# Patient Record
Sex: Female | Born: 1985 | Race: Black or African American | Hispanic: No | Marital: Single | Smoking: Former smoker
Health system: Southern US, Community
[De-identification: ages and names within clinical notes are randomized; demographics above are authoritative.]

## PROBLEM LIST (undated history)

## (undated) DIAGNOSIS — T7840XA Allergy, unspecified, initial encounter: Secondary | ICD-10-CM

## (undated) HISTORY — DX: Allergy, unspecified, initial encounter: T78.40XA

---

## 2007-03-13 ENCOUNTER — Emergency Department (HOSPITAL_COMMUNITY): Admission: EM | Admit: 2007-03-13 | Discharge: 2007-03-13 | Payer: Self-pay | Admitting: Emergency Medicine

## 2007-03-27 ENCOUNTER — Emergency Department (HOSPITAL_COMMUNITY): Admission: EM | Admit: 2007-03-27 | Discharge: 2007-03-27 | Payer: Self-pay | Admitting: Adult Health

## 2007-04-11 ENCOUNTER — Emergency Department (HOSPITAL_COMMUNITY): Admission: EM | Admit: 2007-04-11 | Discharge: 2007-04-11 | Payer: Self-pay | Admitting: Emergency Medicine

## 2009-06-17 ENCOUNTER — Emergency Department (HOSPITAL_COMMUNITY): Admission: EM | Admit: 2009-06-17 | Discharge: 2009-06-17 | Payer: Self-pay | Admitting: Family Medicine

## 2010-11-25 LAB — POCT RAPID STREP A: Streptococcus, Group A Screen (Direct): POSITIVE — AB

## 2010-11-25 LAB — POCT INFECTIOUS MONO SCREEN: Mono Screen: POSITIVE — AB

## 2010-11-26 LAB — POCT URINALYSIS DIP (DEVICE)
Bilirubin Urine: NEGATIVE
Glucose, UA: NEGATIVE
Nitrite: NEGATIVE
Operator id: 239701
Protein, ur: NEGATIVE
pH: 5

## 2010-11-26 LAB — POCT I-STAT CREATININE: Operator id: 239701

## 2010-11-26 LAB — DIFFERENTIAL
Lymphocytes Relative: 23
Lymphs Abs: 1.8
Monocytes Relative: 7
Neutro Abs: 5.6
Neutrophils Relative %: 70

## 2010-11-26 LAB — CBC
Platelets: 232
RBC: 4.42
WBC: 8

## 2010-11-26 LAB — I-STAT 8, (EC8 V) (CONVERTED LAB)
Acid-Base Excess: 1
Glucose, Bld: 98
TCO2: 27
pCO2, Ven: 39.6 — ABNORMAL LOW
pH, Ven: 7.416 — ABNORMAL HIGH

## 2010-11-26 LAB — POCT PREGNANCY, URINE: Operator id: 239701

## 2011-06-26 ENCOUNTER — Encounter (HOSPITAL_COMMUNITY): Payer: Self-pay | Admitting: Emergency Medicine

## 2011-06-26 ENCOUNTER — Emergency Department (HOSPITAL_COMMUNITY)
Admission: EM | Admit: 2011-06-26 | Discharge: 2011-06-26 | Disposition: A | Discharge status: Home / Self Care | Payer: BC Managed Care – PPO | Attending: Emergency Medicine | Admitting: Emergency Medicine

## 2011-06-26 DIAGNOSIS — K089 Disorder of teeth and supporting structures, unspecified: Secondary | ICD-10-CM | POA: Insufficient documentation

## 2011-06-26 DIAGNOSIS — Z79899 Other long term (current) drug therapy: Secondary | ICD-10-CM | POA: Insufficient documentation

## 2011-06-26 DIAGNOSIS — F17200 Nicotine dependence, unspecified, uncomplicated: Secondary | ICD-10-CM | POA: Insufficient documentation

## 2011-06-26 DIAGNOSIS — Z7982 Long term (current) use of aspirin: Secondary | ICD-10-CM | POA: Insufficient documentation

## 2011-06-26 DIAGNOSIS — K029 Dental caries, unspecified: Secondary | ICD-10-CM | POA: Insufficient documentation

## 2011-06-26 MED ORDER — TRAMADOL HCL 50 MG PO TABS
50.0000 mg | ORAL_TABLET | Freq: Once | ORAL | Status: AC
  Administered 2011-06-26: 50 mg via ORAL
  Filled 2011-06-26: qty 1

## 2011-06-26 MED ORDER — TRAMADOL HCL 50 MG PO TABS: 50.0000 mg | ORAL_TABLET | Freq: Once | ORAL | Status: AC

## 2011-06-26 NOTE — Discharge Instructions (Signed)
Dental Caries Dental caries (cavities) are areas of tooth decay. Cavities are usually caused by a combination of poor dental care; sugar; tobacco, alcohol, and drug abuse; decreased saliva production; and receding gums. If cavities are not treated by a dentist, they grow in size. This can cause toothaches, infection, and loss of the tooth. Cavities of the outer tooth enamel do not cause symptoms. Dental pain from cold drinks may be the first sign the enamel has broken down and decay has spread toward the root of the tooth. This can cause the tooth to die or become infected. If a cavity is treated before it causes toothache, the tooth can usually be saved. Cavities can be prevented by good oral hygiene. Brushing your teeth in the morning and before bed, and using dental floss once daily helps remove plaque and reduce bacteria. Candy, soft drinks, and other sources of sugar promote tooth decay by promoting the growth of bacteria in the mouth. Proper diet, fluoride, dental cleaning, and fillings are important in preventing the loss of teeth from decay. Antibiotics, root canal treatment, or dental extraction may be needed if the decay is severe. Take any pain medication or antibiotics as directed by your caregiver. It is important that you follow up with a dentist for definitive care. SEEK MEDICAL CARE IF:   You or your child has an oral temperature above 102 F (38.9 C).   There is difficulty opening the mouth.   There is difficulty swallowing or handling secretions.   There is difficulty breathing.   There is chest pain.   There are worsening or concerning symptoms.  Document Released: 03/31/2004 Document Revised: 02/10/2011 Document Reviewed: 06/16/2009 [facility] Patient Information 2012 [place], [place]. As discussed.  It is very important that you call Dr. Renard Hamper for same.  The morning to set an appointment also, you can purchase and assault, which you're to apply to the cavities with a Q-tip  allowed [place] to set for several minutes before applying temporary filling which she will also purchase at the pharmacy.

## 2011-06-26 NOTE — ED Notes (Signed)
C/o toothache x 4 months.

## 2011-06-26 NOTE — ED Provider Notes (Signed)
History     CSN: [id number]  Arrival date & time 06/26/11  2048   First MD Initiated Contact with Patient 06/26/11 2100      Chief Complaint  Patient presents with  . Dental Pain    (Consider location/radiation/quality/duration/timing/severity/associated sxs/prior treatment) HPI Comments: Patient has 2 large cavities, one on either side in the upper molars.  This is been going on for the last 4 months has been taking over-the-counter aspirin without relief.  Denies gum swelling or facial swelling  Patient is a 26 y.o. female presenting with tooth pain. The history is provided by the patient.  Dental PainThe primary symptoms include mouth pain. Primary symptoms do not include dental injury, headaches or fever. The symptoms began more than 1 month ago. The symptoms are unchanged. The symptoms occur intermittently.  Additional symptoms do not include: ear pain.    History reviewed. No pertinent past medical history.  History reviewed. No pertinent past surgical history.  No family history on file.  History  Substance Use Topics  . Smoking status: Current Everyday Smoker  . Smokeless tobacco: Not on file  . Alcohol Use: Yes    OB History    Grav Para Term Preterm Abortions TAB SAB Ect Mult Living                  Review of Systems  Constitutional: Negative for fever.  HENT: Positive for dental problem. Negative for ear pain and rhinorrhea.   Neurological: Negative for dizziness and headaches.    Allergies  Review of patient's allergies indicates no known allergies.  Home Medications   Current Outpatient Rx  Name Route Sig Dispense Refill  . ASPIRIN 500 MG PO TBEC Oral Take 500 mg by mouth every 6 (six) hours as needed. For pain    . TRAMADOL HCL 50 MG PO TABS Oral Take 1 tablet (50 mg total) by mouth once. 30 tablet 0    BP 143/97  Pulse 70  Temp(Src) 97 F (36.1 C) (Oral)  Resp 20  SpO2 97%  LMP 05/25/2011  Physical Exam  Constitutional: She appears  well-developed.  HENT:  Mouth/Throat:    Neck: Normal range of motion.  Cardiovascular: Normal rate.   Pulmonary/Chest: Effort normal.  Musculoskeletal: Normal range of motion.  Neurological: She is alert.    ED Course  Procedures (including critical care time)  Labs Reviewed - No data to display No results found.   1. Dental caries       MDM  Patient has 2 large cavities without some inflammation.  No facial swelling have described to patient how to treat with over-the-counter temporary filling in and assault.  Also provide Ultram for pain relief and referral to a dentist        Arman Filter, NP 06/26/11 2140  Arman Filter, NP 06/26/11 2140

## 2011-06-27 NOTE — ED Provider Notes (Signed)
Medical screening examination/treatment/procedure(s) were performed by non-physician practitioner and as supervising physician I was immediately available for consultation/collaboration.   Carleene Cooper III, MD 06/27/11 2020

## 2013-05-31 ENCOUNTER — Encounter (HOSPITAL_COMMUNITY): Payer: Self-pay | Admitting: Emergency Medicine

## 2013-05-31 ENCOUNTER — Emergency Department (HOSPITAL_COMMUNITY): Payer: Self-pay

## 2013-05-31 ENCOUNTER — Inpatient Hospital Stay (HOSPITAL_COMMUNITY)
Admission: EM | Admit: 2013-05-31 | Discharge: 2013-06-02 | DRG: 505 | Disposition: A | Discharge status: Home / Self Care | Payer: Self-pay | Attending: Orthopedic Surgery | Admitting: Orthopedic Surgery

## 2013-05-31 DIAGNOSIS — Y93K9 Activity, other involving animal care: Secondary | ICD-10-CM

## 2013-05-31 DIAGNOSIS — S92109B Unspecified fracture of unspecified talus, initial encounter for open fracture: Principal | ICD-10-CM | POA: Diagnosis present

## 2013-05-31 DIAGNOSIS — F17200 Nicotine dependence, unspecified, uncomplicated: Secondary | ICD-10-CM | POA: Diagnosis present

## 2013-05-31 DIAGNOSIS — S82899B Other fracture of unspecified lower leg, initial encounter for open fracture type I or II: Secondary | ICD-10-CM | POA: Diagnosis present

## 2013-05-31 MED ORDER — HYDROMORPHONE HCL PF 1 MG/ML IJ SOLN
1.0000 mg | Freq: Once | INTRAMUSCULAR | Status: AC
  Administered 2013-06-01: 1 mg via INTRAMUSCULAR
  Filled 2013-05-31: qty 1

## 2013-05-31 MED ORDER — CEFAZOLIN SODIUM 1-5 GM-% IV SOLN
1.0000 g | Freq: Once | INTRAVENOUS | Status: AC
  Administered 2013-06-01: 1 g via INTRAVENOUS
  Filled 2013-05-31: qty 50

## 2013-05-31 NOTE — ED Notes (Signed)
Pt presents with an ankle fracture, bone protruding though the skin, pedal pulse palparable, able to report sensation, cap refil assessed <3. Report she fell twisted her ankle and noticed pain 10/10. Pt in mild pain induced distress.

## 2013-05-31 NOTE — ED Notes (Signed)
Bed: [id number] Expected date:  Expected time:  Means of arrival:  Comments: Drawdy  (ankle)

## 2013-06-01 ENCOUNTER — Emergency Department (HOSPITAL_COMMUNITY): Payer: BC Managed Care – PPO | Admitting: Certified Registered Nurse Anesthetist

## 2013-06-01 ENCOUNTER — Emergency Department (HOSPITAL_COMMUNITY): Payer: Self-pay

## 2013-06-01 ENCOUNTER — Encounter (HOSPITAL_COMMUNITY): Payer: Self-pay | Admitting: Certified Registered Nurse Anesthetist

## 2013-06-01 ENCOUNTER — Encounter (HOSPITAL_COMMUNITY)
Admission: EM | Disposition: A | Discharge status: Home / Self Care | Payer: Self-pay | Source: Home / Self Care | Attending: Orthopedic Surgery

## 2013-06-01 ENCOUNTER — Other Ambulatory Visit: Payer: Self-pay | Admitting: Physician Assistant

## 2013-06-01 DIAGNOSIS — S82899B Other fracture of unspecified lower leg, initial encounter for open fracture type I or II: Secondary | ICD-10-CM | POA: Diagnosis present

## 2013-06-01 HISTORY — PX: ORIF ANKLE FRACTURE: SHX5408

## 2013-06-01 LAB — BASIC METABOLIC PANEL
BUN: 11 mg/dL (ref 6–23)
CO2: 24 mEq/L (ref 19–32)
Calcium: 9.2 mg/dL (ref 8.4–10.5)
Chloride: 103 mEq/L (ref 96–112)
Creatinine, Ser: 0.81 mg/dL (ref 0.50–1.10)
GFR calc Af Amer: >90 mL/min (ref >90)
GFR calc non Af Amer: >90 mL/min (ref >90)
Glucose, Bld: 147 mg/dL — ABNORMAL HIGH (ref 70–99)
Potassium: 3.9 mEq/L (ref 3.7–5.3)
Sodium: 141 mEq/L (ref 137–147)

## 2013-06-01 LAB — HCG, SERUM, QUALITATIVE: Preg, Serum: NEGATIVE

## 2013-06-01 LAB — CBC
HCT: 39.3 % (ref 36.0–46.0)
Hemoglobin: 13.5 g/dL (ref 12.0–15.0)
MCH: 30.1 pg (ref 26.0–34.0)
MCHC: 34.4 g/dL (ref 30.0–36.0)
MCV: 87.5 fL (ref 78.0–100.0)
Platelets: 297 10*3/uL (ref 150–400)
RBC: 4.49 MIL/uL (ref 3.87–5.11)
RDW: 12.6 % (ref 11.5–15.5)
WBC: 7.9 10*3/uL (ref 4.0–10.5)

## 2013-06-01 LAB — ETHANOL: Alcohol, Ethyl (B): <11 mg/dL (ref 0–11)

## 2013-06-01 SURGERY — OPEN REDUCTION INTERNAL FIXATION (ORIF) ANKLE FRACTURE
Anesthesia: General | Site: Ankle | Laterality: Left

## 2013-06-01 MED ORDER — ACETAMINOPHEN 10 MG/ML IV SOLN
1000.0000 mg | Freq: Once | INTRAVENOUS | Status: AC
  Administered 2013-06-01: 1000 mg via INTRAVENOUS
  Filled 2013-06-01: qty 100

## 2013-06-01 MED ORDER — BISACODYL 5 MG PO TBEC: 5.0000 mg | DELAYED_RELEASE_TABLET | Freq: Every day | ORAL | Status: DC | PRN

## 2013-06-01 MED ORDER — OXYCODONE HCL 5 MG PO TABS: 5.0000 mg | ORAL_TABLET | ORAL | Status: DC | PRN

## 2013-06-01 MED ORDER — HYDROMORPHONE HCL PF 1 MG/ML IJ SOLN: 0.5000 mg | INTRAMUSCULAR | Status: DC | PRN

## 2013-06-01 MED ORDER — ASPIRIN EC 325 MG PO TBEC: 325.0000 mg | DELAYED_RELEASE_TABLET | Freq: Every day | ORAL | Status: DC

## 2013-06-01 MED ORDER — HYDROMORPHONE HCL PF 1 MG/ML IJ SOLN: 0.2500 mg | INTRAMUSCULAR | Status: DC | PRN

## 2013-06-01 MED ORDER — METOCLOPRAMIDE HCL 5 MG/ML IJ SOLN
INTRAMUSCULAR | Status: DC | PRN
  Administered 2013-06-01: 5 mg via INTRAVENOUS

## 2013-06-01 MED ORDER — HYDRALAZINE HCL 20 MG/ML IJ SOLN
INTRAMUSCULAR | Status: AC
Start: 2013-06-01 — End: 2013-06-01
  Filled 2013-06-01: qty 1

## 2013-06-01 MED ORDER — METOCLOPRAMIDE HCL 5 MG/ML IJ SOLN
5.0000 mg | Freq: Three times a day (TID) | INTRAMUSCULAR | Status: DC | PRN
  Filled 2013-06-01: qty 2

## 2013-06-01 MED ORDER — KETOROLAC TROMETHAMINE 15 MG/ML IJ SOLN
INTRAMUSCULAR | Status: AC
  Filled 2013-06-01: qty 1

## 2013-06-01 MED ORDER — FENTANYL CITRATE 0.05 MG/ML IJ SOLN
INTRAMUSCULAR | Status: AC
  Filled 2013-06-01: qty 5

## 2013-06-01 MED ORDER — CISATRACURIUM BESYLATE (PF) 10 MG/5ML IV SOLN
INTRAVENOUS | Status: DC | PRN
  Administered 2013-06-01: 2 mg via INTRAVENOUS
  Administered 2013-06-01: 6 mg via INTRAVENOUS
  Administered 2013-06-01: 2 mg via INTRAVENOUS

## 2013-06-01 MED ORDER — CEFAZOLIN SODIUM-DEXTROSE 2-3 GM-% IV SOLR
2.0000 g | Freq: Three times a day (TID) | INTRAVENOUS | Status: AC
  Administered 2013-06-01 – 2013-06-02 (×4): 2 g via INTRAVENOUS
  Filled 2013-06-01 (×3): qty 50

## 2013-06-01 MED ORDER — NEOSTIGMINE METHYLSULFATE 1 MG/ML IJ SOLN
INTRAMUSCULAR | Status: DC | PRN
  Administered 2013-06-01: 2 mg via INTRAVENOUS

## 2013-06-01 MED ORDER — METOCLOPRAMIDE HCL 10 MG PO TABS: 5.0000 mg | ORAL_TABLET | Freq: Three times a day (TID) | ORAL | Status: DC | PRN

## 2013-06-01 MED ORDER — GLYCOPYRROLATE 0.2 MG/ML IJ SOLN
INTRAMUSCULAR | Status: DC | PRN
  Administered 2013-06-01: 0.4 mg via INTRAVENOUS

## 2013-06-01 MED ORDER — OXYCODONE HCL 5 MG PO TABS
5.0000 mg | ORAL_TABLET | ORAL | Status: DC | PRN
  Administered 2013-06-01 (×2): 5 mg via ORAL
  Administered 2013-06-02: 10 mg via ORAL
  Filled 2013-06-01: qty 1
  Filled 2013-06-01: qty 2
  Filled 2013-06-01: qty 1

## 2013-06-01 MED ORDER — ONDANSETRON HCL 4 MG/2ML IJ SOLN: 4.0000 mg | Freq: Four times a day (QID) | INTRAMUSCULAR | Status: DC | PRN

## 2013-06-01 MED ORDER — CEPHALEXIN 500 MG PO CAPS: 500.0000 mg | ORAL_CAPSULE | Freq: Four times a day (QID) | ORAL | Status: DC

## 2013-06-01 MED ORDER — FENTANYL CITRATE 0.05 MG/ML IJ SOLN
INTRAMUSCULAR | Status: DC | PRN
  Administered 2013-06-01: 150 ug via INTRAVENOUS
  Administered 2013-06-01 (×4): 50 ug via INTRAVENOUS

## 2013-06-01 MED ORDER — CEFAZOLIN SODIUM-DEXTROSE 2-3 GM-% IV SOLR
INTRAVENOUS | Status: DC | PRN
  Administered 2013-06-01: 2 g via INTRAVENOUS

## 2013-06-01 MED ORDER — CEFAZOLIN SODIUM-DEXTROSE 2-3 GM-% IV SOLR
INTRAVENOUS | Status: AC
  Filled 2013-06-01: qty 50

## 2013-06-01 MED ORDER — PROPOFOL 10 MG/ML IV BOLUS
INTRAVENOUS | Status: AC
  Filled 2013-06-01: qty 20

## 2013-06-01 MED ORDER — PROMETHAZINE HCL 25 MG/ML IJ SOLN: 6.2500 mg | INTRAMUSCULAR | Status: DC | PRN

## 2013-06-01 MED ORDER — HYDRALAZINE HCL 20 MG/ML IJ SOLN
INTRAMUSCULAR | Status: DC | PRN
  Administered 2013-06-01 (×2): 5 mg via INTRAVENOUS
  Administered 2013-06-01: 10 mg via INTRAVENOUS

## 2013-06-01 MED ORDER — LIDOCAINE HCL (CARDIAC) 20 MG/ML IV SOLN
INTRAVENOUS | Status: DC | PRN
  Administered 2013-06-01: 100 mg via INTRAVENOUS

## 2013-06-01 MED ORDER — MIDAZOLAM HCL 2 MG/2ML IJ SOLN
INTRAMUSCULAR | Status: AC
  Filled 2013-06-01: qty 2

## 2013-06-01 MED ORDER — METHOCARBAMOL 100 MG/ML IJ SOLN
500.0000 mg | Freq: Four times a day (QID) | INTRAVENOUS | Status: DC | PRN
  Filled 2013-06-01: qty 5

## 2013-06-01 MED ORDER — DEXAMETHASONE SODIUM PHOSPHATE 10 MG/ML IJ SOLN
INTRAMUSCULAR | Status: DC | PRN
  Administered 2013-06-01: 10 mg via INTRAVENOUS

## 2013-06-01 MED ORDER — DIPHENHYDRAMINE HCL 12.5 MG/5ML PO ELIX: 12.5000 mg | ORAL_SOLUTION | ORAL | Status: DC | PRN

## 2013-06-01 MED ORDER — LABETALOL HCL 5 MG/ML IV SOLN
INTRAVENOUS | Status: DC | PRN
  Administered 2013-06-01: 2.5 mg via INTRAVENOUS
  Administered 2013-06-01: 5 mg via INTRAVENOUS
  Administered 2013-06-01: 2.5 mg via INTRAVENOUS
  Administered 2013-06-01: 5 mg via INTRAVENOUS

## 2013-06-01 MED ORDER — LIDOCAINE HCL (CARDIAC) 20 MG/ML IV SOLN
INTRAVENOUS | Status: AC
  Filled 2013-06-01: qty 5

## 2013-06-01 MED ORDER — LABETALOL HCL 5 MG/ML IV SOLN
INTRAVENOUS | Status: AC
  Filled 2013-06-01: qty 4

## 2013-06-01 MED ORDER — DOCUSATE SODIUM 100 MG PO CAPS
100.0000 mg | ORAL_CAPSULE | Freq: Two times a day (BID) | ORAL | Status: DC
  Administered 2013-06-01 – 2013-06-02 (×3): 100 mg via ORAL
  Filled 2013-06-01 (×4): qty 1

## 2013-06-01 MED ORDER — ONDANSETRON HCL 4 MG/2ML IJ SOLN
INTRAMUSCULAR | Status: DC | PRN
  Administered 2013-06-01 (×2): 2 mg via INTRAVENOUS

## 2013-06-01 MED ORDER — CISATRACURIUM BESYLATE 20 MG/10ML IV SOLN
INTRAVENOUS | Status: AC
  Filled 2013-06-01: qty 10

## 2013-06-01 MED ORDER — SUCCINYLCHOLINE CHLORIDE 20 MG/ML IJ SOLN
INTRAMUSCULAR | Status: DC | PRN
  Administered 2013-06-01: 140 mg via INTRAVENOUS

## 2013-06-01 MED ORDER — SODIUM CHLORIDE 0.9 % IR SOLN
Status: DC | PRN
  Administered 2013-06-01: 1000 mL

## 2013-06-01 MED ORDER — LACTATED RINGERS IV SOLN
INTRAVENOUS | Status: DC | PRN
  Administered 2013-06-01 (×2): via INTRAVENOUS

## 2013-06-01 MED ORDER — ONDANSETRON HCL 4 MG PO TABS: 4.0000 mg | ORAL_TABLET | Freq: Four times a day (QID) | ORAL | Status: DC | PRN

## 2013-06-01 MED ORDER — ASPIRIN 325 MG PO TABS
325.0000 mg | ORAL_TABLET | Freq: Every day | ORAL | Status: DC
  Administered 2013-06-01 – 2013-06-02 (×2): 325 mg via ORAL
  Filled 2013-06-01 (×2): qty 1

## 2013-06-01 MED ORDER — KETOROLAC TROMETHAMINE 30 MG/ML IJ SOLN
15.0000 mg | Freq: Once | INTRAMUSCULAR | Status: AC | PRN
  Administered 2013-06-01: 15 mg via INTRAVENOUS

## 2013-06-01 MED ORDER — NEOSTIGMINE METHYLSULFATE 1 MG/ML IJ SOLN
INTRAMUSCULAR | Status: AC
  Filled 2013-06-01: qty 10

## 2013-06-01 MED ORDER — METHOCARBAMOL 500 MG PO TABS
500.0000 mg | ORAL_TABLET | Freq: Four times a day (QID) | ORAL | Status: DC | PRN
  Administered 2013-06-01: 500 mg via ORAL
  Filled 2013-06-01: qty 1

## 2013-06-01 MED ORDER — PROPOFOL 10 MG/ML IV BOLUS
INTRAVENOUS | Status: DC | PRN
  Administered 2013-06-01: 200 mg via INTRAVENOUS

## 2013-06-01 MED ORDER — GLYCOPYRROLATE 0.2 MG/ML IJ SOLN
INTRAMUSCULAR | Status: AC
  Filled 2013-06-01: qty 3

## 2013-06-01 MED ORDER — ONDANSETRON HCL 4 MG PO TABS: 4.0000 mg | ORAL_TABLET | Freq: Three times a day (TID) | ORAL | Status: DC | PRN

## 2013-06-01 MED ORDER — MIDAZOLAM HCL 5 MG/5ML IJ SOLN
INTRAMUSCULAR | Status: DC | PRN
  Administered 2013-06-01 (×2): 1 mg via INTRAVENOUS

## 2013-06-01 MED ORDER — POTASSIUM CHLORIDE IN NACL 20-0.9 MEQ/L-% IV SOLN
INTRAVENOUS | Status: DC
  Administered 2013-06-01: 07:00:00 via INTRAVENOUS
  Filled 2013-06-01 (×5): qty 1000

## 2013-06-01 SURGICAL SUPPLY — 31 items
BAG ZIPLOCK 12X15 (MISCELLANEOUS) ×3 IMPLANT
BANDAGE ELASTIC 4 VELCRO ST LF (GAUZE/BANDAGES/DRESSINGS) ×3 IMPLANT
BANDAGE ELASTIC 6 VELCRO ST LF (GAUZE/BANDAGES/DRESSINGS) ×3 IMPLANT
BIT DRILL CANN 2.7X625 NONSTRL (BIT) ×6 IMPLANT
CLOSURE WOUND 1/2 X4 (GAUZE/BANDAGES/DRESSINGS) ×1
CUFF TOURN SGL QUICK 34 (TOURNIQUET CUFF) ×2
CUFF TRNQT CYL 34X4X40X1 (TOURNIQUET CUFF) ×1 IMPLANT
DRAPE C-ARM 42X120 X-RAY (DRAPES) ×3 IMPLANT
DRAPE U-SHAPE 47X51 STRL (DRAPES) ×3 IMPLANT
DRSG ADAPTIC 3X8 NADH LF (GAUZE/BANDAGES/DRESSINGS) ×3 IMPLANT
DRSG PAD ABDOMINAL 8X10 ST (GAUZE/BANDAGES/DRESSINGS) ×3 IMPLANT
ELECT REM PT RETURN 9FT ADLT (ELECTROSURGICAL) ×3
ELECTRODE REM PT RTRN 9FT ADLT (ELECTROSURGICAL) ×1 IMPLANT
GAUZE XEROFORM 5X9 LF (GAUZE/BANDAGES/DRESSINGS) ×3 IMPLANT
GUIDEWIRE THREADED 150MM (WIRE) ×9 IMPLANT
HANDPIECE INTERPULSE COAX TIP (DISPOSABLE) ×2
MANIFOLD NEPTUNE II (INSTRUMENTS) ×3 IMPLANT
PACK LOWER EXTREMITY WL (CUSTOM PROCEDURE TRAY) ×3 IMPLANT
PAD CAST 4YDX4 CTTN HI CHSV (CAST SUPPLIES) ×1 IMPLANT
PADDING CAST COTTON 4X4 STRL (CAST SUPPLIES) ×2
POSITIONER SURGICAL ARM (MISCELLANEOUS) ×3 IMPLANT
SCREW CANC PT 4.0X30 (Screw) ×6 IMPLANT
SET HNDPC FAN SPRY TIP SCT (DISPOSABLE) ×1 IMPLANT
SPONGE GAUZE 4X4 12PLY (GAUZE/BANDAGES/DRESSINGS) ×3 IMPLANT
STRIP CLOSURE SKIN 1/2X4 (GAUZE/BANDAGES/DRESSINGS) ×2 IMPLANT
SUT MNCRL AB 4-0 PS2 18 (SUTURE) ×3 IMPLANT
SUT NYLON 3 0 (SUTURE) ×6 IMPLANT
SUT VIC AB 1 CT1 27 (SUTURE) ×4
SUT VIC AB 1 CT1 27XBRD ANTBC (SUTURE) ×2 IMPLANT
SUT VIC AB 2-0 CT1 27 (SUTURE) ×2
SUT VIC AB 2-0 CT1 TAPERPNT 27 (SUTURE) ×1 IMPLANT

## 2013-06-01 NOTE — ED Provider Notes (Signed)
CSN: [id number]     Arrival date & time 05/31/13  2320 History   First MD Initiated Contact with Patient 05/31/13 2327     Chief Complaint  Patient presents with  . Ankle Injury     (Consider location/radiation/quality/duration/timing/severity/associated sxs/prior Treatment) HPI Patient presents to the emergency department with a left ankle and foot injury.  The patient, states she was chasing her dog, when she fell, and her ankle twisted.  She states that she noticed, what appeared to be bone sticking out of the skin.  She denies any other injuries.  She states she did not hit her head lose consciousness, chest pain, shortness of breath, nausea, vomiting, diarrhea, or syncope.  Patient, states she's never had any orthopedic injuries. History reviewed. No pertinent past medical history. History reviewed. No pertinent past surgical history. No family history on file. History  Substance Use Topics  . Smoking status: Current Every Day Smoker    Types: Cigarettes  . Smokeless tobacco: Not on file  . Alcohol Use: Yes   OB History   Grav Para Term Preterm Abortions TAB SAB Ect Mult Living                 Review of Systems  All other systems negative except as documented in the HPI. All pertinent positives and negatives as reviewed in the HPI.  Allergies  Review of patient's allergies indicates no known allergies.  Home Medications   Current Outpatient Rx  Name  Route  Sig  Dispense  Refill  . aspirin 500 MG EC tablet   Oral   Take 500 mg by mouth every 6 (six) hours as needed. For pain          BP 134/85  Pulse 93  Temp(Src) 97.9 F (36.6 C) (Oral)  Resp 24  SpO2 99%  LMP 05/17/2013 Physical Exam  Constitutional: She is oriented to person, place, and time. She appears well-developed and well-nourished. No distress.  HENT:  Head: Normocephalic and atraumatic.  Mouth/Throat: Oropharynx is clear and moist.  Neck: Normal range of motion. Neck supple.  Cardiovascular:  Normal rate, regular rhythm and normal heart sounds.  Exam reveals no gallop and no friction rub.   No murmur heard. Pulmonary/Chest: Effort normal and breath sounds normal.  Musculoskeletal:       Feet:  Neurological: She is alert and oriented to person, place, and time. She exhibits normal muscle tone. Coordination normal.  Skin: Skin is warm and dry.    ED Course  Procedures (including critical care time) Labs Review Labs Reviewed  BASIC METABOLIC PANEL - Abnormal; Notable for the following:    Glucose, Bld 147 (*)    All other components within normal limits  CBC  HCG, SERUM, QUALITATIVE  ETHANOL   Imaging Review Dg Ankle Complete Left  06/01/2013   CLINICAL DATA:  Fall with ankle pain.  EXAM: LEFT ANKLE COMPLETE - 3+ VIEW  COMPARISON:  None.  FINDINGS: There is a significantly displaced talar neck fracture. Additionally, there is abnormal rotation of the calcaneus relative to the talus. The tibiotalar joint appears located. There is subcutaneous gas consistent with open fracture.  IMPRESSION: Open, displaced talar neck fracture. Suspect subtalar joint injury/malalignment. Suggest CT to more completely evaluate the extent of osseous injury.   Electronically Signed   By: Tiburcio Pea M.D.   On: 06/01/2013 00:06   Spoke With the orthopedic surgeon, who will be in to evaluate the patient.  Patient is placed on Ancef IV.  Patient is stable at this time.  She has been n.p.o. since 10:30 PM   Carlyle Dollyhristopher W Rober Skeels, PA-C 06/01/13 0140

## 2013-06-01 NOTE — ED Notes (Signed)
Orthopedic Surgeon at bedside

## 2013-06-01 NOTE — Transfer of Care (Signed)
Immediate Anesthesia Transfer of Care Note  Patient: Felicia Allison  Procedure(s) Performed: Procedure(s): OPEN REDUCTION INTERNAL FIXATION (ORIF) ANKLE FRACTURE (Left)  Patient Location: PACU  Anesthesia Type:General  Level of Consciousness: awake, oriented, patient cooperative, lethargic and responds to stimulation  Airway & Oxygen Therapy: Patient Spontanous Breathing and Patient connected to face mask oxygen  Post-op Assessment: Report given to PACU RN, Post -op Vital signs reviewed and stable and Patient moving all extremities  Post vital signs: Reviewed and stable  Complications: No apparent anesthesia complications

## 2013-06-01 NOTE — Discharge Instructions (Signed)
ORIF Talus fracture, Care After Refer to this sheet in the next few weeks. These instructions provide you with information on caring for yourself after your procedure. Your health care provider may also give you more specific instructions. Your treatment has been planned according to current medical practices, but problems sometimes occur. Call your health care provider if you have any problems or questions after your procedure. WHAT TO EXPECT AFTER THE PROCEDURE After your procedure, it is typical to have the following:  Swelling, bruising, and soreness at your ankle and foot. You will be given pain medicines to control this.  Stiffness when you move your ankle. HOME CARE INSTRUCTIONS   NON-weight bearing at all times.  Do not remove splint.  Take Aspirin 1 tab a day for the next 30 days to prevent blood clots.  May shower on Tuesday, but do not let splint get wet.  May apply ice for up to 20 minutes at a time for pain and swelling.  Follow up appointment in our office in one week.  You may resume your normal diet and activities as directed or allowed.  Apply ice to the injured area:  Put ice in a plastic bag.  Place a towel between your skin and the bag.  Leave the ice on for 20 minutes, 2 3 times a day.  If you have a plaster or fiberglass cast:  Do not try to scratch the skin under the cast using sharp or pointed objects.  Check the skin around the cast every day. You may put lotion on any red or sore areas.  Keep your cast dry and clean.  Do not put pressure on any part of your cast or splint until it is fully hardened.  Your cast or splint can be protected during bathing with a plastic bag. Do not lower the cast or splint into water.  Only take over-the-counter or prescription medicines for pain, discomfort, or fever as directed by your health care provider.  Use crutches as directed, and do not exercise the leg unless instructed.  Your health care provider may instruct  you to remove your cam boot.  Keep follow-up appointments as directed.  Do not drive a car or operate a motor vehicle until your health care provider specifically tells you it is safe to do so. SEEK MEDICAL CARE IF:  You have redness, swelling, or increasing pain in your wound.   You have pus coming from your wound.   You notice a bad smell coming from your wound or dressing.   Your wound breaks open (edges not staying together) after sutures or staples have been removed.   You have numbness in your foot that is getting worse. If you do not have a window in your cast for observing the wound, drainage or minor bleeding may show up as a stain on the outside of your cast. Report these findings to your health care provider. SEEK IMMEDIATE MEDICAL CARE IF:  You have chest pain or shortness of breath. Document Released: 12/12/2012 Document Reviewed: 09/20/2012 [facility] Patient Information 2014 [place].

## 2013-06-01 NOTE — ED Provider Notes (Signed)
Medical screening examination/treatment/procedure(s) were conducted as a shared visit with non-physician practitioner(s) and myself.  I personally evaluated the patient during the encounter.   EKG Interpretation None      Open left talar fracture.  N.p.o.  Ancef.  Orthopedic consultation and operative washout with repair.  Normal pulses in left foot.  Compartments soft  Dg Ankle Complete Left  06/01/2013   CLINICAL DATA:  Fall with ankle pain.  EXAM: LEFT ANKLE COMPLETE - 3+ VIEW  COMPARISON:  None.  FINDINGS: There is a significantly displaced talar neck fracture. Additionally, there is abnormal rotation of the calcaneus relative to the talus. The tibiotalar joint appears located. There is subcutaneous gas consistent with open fracture.  IMPRESSION: Open, displaced talar neck fracture. Suspect subtalar joint injury/malalignment. Suggest CT to more completely evaluate the extent of osseous injury.   Electronically Signed   By: Tiburcio PeaJonathan  Watts M.D.   On: 06/01/2013 00:06   Ct Ankle Left Wo Contrast  06/01/2013   CLINICAL DATA:  Open fracture.  EXAM: CT OF THE LEFT ANKLE WITHOUT CONTRAST  TECHNIQUE: Multidetector CT imaging was performed according to the standard protocol. Multiplanar CT image reconstructions were also generated.  COMPARISON:  Radiography from yesterday  FINDINGS: There is comminuted fracturing of the talar neck, with is severe displacement causing the proximal fragment to penetrate the lateral anterior ankle skin. There is extensive soft tissue gas, tracking throughout the subcutaneous compartment and into the tibiotalar, talonavicular, and subtalar joints. Gas tracks proximally along the medial and lateral tendon sheaths. There is dislocation of the subtalar joint, with widening especially notable laterally. The distal talus fragment is subluxed at its articulation with the navicular bone, with extensive intra-articular gas. Multiple small osseous fragments within the subtalar joint,  measuring up to 5 mm. There is a 3 mm long osseous fragment in the medial gutter of the tibiotubular joint.  Tibialis posterior is presumably torn at its distal attachment. There is no evidence of other tendon tear or entrapment.  IMPRESSION: 1. Comminuted, open talar neck fracture. 2. Hindfoot dislocation that is partially reduced. 3. Fracture fragments within the subtalar joint and in the medial gutter of the tibiotalar joint.   Electronically Signed   By: Tiburcio PeaJonathan  Watts M.D.   On: 06/01/2013 01:20  I personally reviewed the imaging tests through PACS system I reviewed available ER/hospitalization records through the EMR   Lyanne CoKevin M Hawraa Stambaugh, MD 06/01/13 [phone]

## 2013-06-01 NOTE — Anesthesia Preprocedure Evaluation (Addendum)
Anesthesia Evaluation  Patient identified by MRN, date of birth, ID band Patient awake    Reviewed: Allergy & Precautions, H&P , NPO status , Patient's Chart, lab work & pertinent test results  Airway Mallampati: II TM Distance: >3 FB Neck ROM: Full    Dental  (+) Missing, Poor Dentition, Chipped   Pulmonary Current Smoker,  breath sounds clear to auscultation  Pulmonary exam normal       Cardiovascular negative cardio ROS  Rhythm:Regular Rate:Normal     Neuro/Psych negative neurological ROS  negative psych ROS   GI/Hepatic negative GI ROS, Neg liver ROS,   Endo/Other  Morbid obesity  Renal/GU negative Renal ROS  negative genitourinary   Musculoskeletal negative musculoskeletal ROS (+)   Abdominal (+) + obese,   Peds negative pediatric ROS (+)  Hematology negative hematology ROS (+)   Anesthesia Other Findings   Reproductive/Obstetrics negative OB ROS                         Anesthesia Physical Anesthesia Plan  ASA: II and emergent  Anesthesia Plan: General   Post-op Pain Management:    Induction: Intravenous and Rapid sequence  Airway Management Planned: Oral ETT  Additional Equipment:   Intra-op Plan:   Post-operative Plan: Extubation in OR  Informed Consent: I have reviewed the patients History and Physical, chart, labs and discussed the procedure including the risks, benefits and alternatives for the proposed anesthesia with the patient or authorized representative who has indicated his/her understanding and acceptance.   Dental advisory given  Plan Discussed with: CRNA and Surgeon  Anesthesia Plan Comments: (Solids at 2200)       Anesthesia Quick Evaluation

## 2013-06-01 NOTE — Consult Note (Signed)
ORTHOPAEDIC CONSULTATION  REQUESTING PHYSICIAN: Hoy Morn, MD  Chief Complaint: "I hurt my ankle"  HPI: Felicia Allison is a 28 y.o. female who complains of left ankle pain.  She states that she was jumping on and off of the top of her car while playing with her dog a few hours ago.  She fell off the top of her car and inverted her left ankle.  She had immediate pain and noticed a fairly significant deformity.  She was unable to weight bear due to pain but states that she still has feeling and some motion in her left foot.    History reviewed. No pertinent past medical history. History reviewed. No pertinent past surgical history. History   Social History  . Marital Status: Single    Spouse Name: N/A    Number of Children: N/A  . Years of Education: N/A   Social History Main Topics  . Smoking status: Current Every Day Smoker    Types: Cigarettes  . Smokeless tobacco: None  . Alcohol Use: Yes  . Drug Use: No  . Sexual Activity: None   Other Topics Concern  . None   Social History Narrative  . None   No family history on file. No Known Allergies Prior to Admission medications   Medication Sig Start Date End Date Taking? Authorizing Provider  aspirin 500 MG EC tablet Take 500 mg by mouth every 6 (six) hours as needed. For pain    Historical Provider, MD   Dg Ankle Complete Left  06/01/2013   CLINICAL DATA:  Fall with ankle pain.  EXAM: LEFT ANKLE COMPLETE - 3+ VIEW  COMPARISON:  None.  FINDINGS: There is a significantly displaced talar neck fracture. Additionally, there is abnormal rotation of the calcaneus relative to the talus. The tibiotalar joint appears located. There is subcutaneous gas consistent with open fracture.  IMPRESSION: Open, displaced talar neck fracture. Suspect subtalar joint injury/malalignment. Suggest CT to more completely evaluate the extent of osseous injury.   Electronically Signed   By: Jorje Guild M.D.   On: 06/01/2013 00:06    Positive  ROS: All other systems have been reviewed and were otherwise negative with the exception of those mentioned in the HPI and as above.  Labs cbc  Recent Labs  06/01/13 0005  WBC 7.9  HGB 13.5  HCT 39.3  PLT 297    Labs inflam No results found for this basename: ESR, CRP,  in the last 72 hours  Labs coag No results found for this basename: INR, PT, PTT,  in the last 72 hours   Recent Labs  06/01/13 0005  NA 141  K 3.9  CL 103  CO2 24  GLUCOSE 147*  BUN 11  CREATININE 0.81  CALCIUM 9.2    Physical Exam: Filed Vitals:   05/31/13 2337  BP:   Pulse:   Temp: 97.9 F (36.6 C)  Resp:    General: Alert, no acute distress Cardiovascular: No pedal edema Respiratory: No cyanosis, no use of accessory musculature GI: No organomegaly, abdomen is soft and non-tender Skin: grade 3 wound.  Open fracture through lateral aspect of left lower leg. Neurologic: Sensation intact distally Psychiatric: Patient is competent for consent with normal mood and affect Lymphatic: No axillary or cervical lymphadenopathy  MUSCULOSKELETAL:  Open distal talus fracture through lateral aspect of left lower leg Plantar flexion/dorsiflexion intact.  FHL/EHL firing. Mild to moderate swelling left foot Other extremities are atraumatic with painless ROM  and NVI.  Assessment: Open, displaced talar neck fracture.  Subtalar joint dislocation.  Plan: OR tonight for ORIF left ankle Non-weight bearing Antibiotics given   Larae Grooms, PA-C Cell [phone]   06/01/2013 1:15 AM

## 2013-06-01 NOTE — Interval H&P Note (Signed)
History and Physical Interval Note:  06/01/2013 1:16 AM  Felicia Allison  has presented today for surgery, with the diagnosis of fracture ,dislocation talus  The various methods of treatment have been discussed with the patient and family. After consideration of risks, benefits and other options for treatment, the patient has consented to  Procedure(s): OPEN REDUCTION INTERNAL FIXATION (ORIF) ANKLE FRACTURE (Left) as a surgical intervention .  The patient's history has been reviewed, patient examined, no change in status, stable for surgery.  I have reviewed the patient's chart and labs.  Questions were answered to the patient's satisfaction.     [name] F

## 2013-06-01 NOTE — Discharge Summary (Signed)
Patient ID: Felicia Allison MRN: [id number] DOB/AGE: 08/27/1985 28 y.o.  Admit date: 05/31/2013 Discharge date: 06/01/2013  Admission Diagnoses:  Active Problems:   Open ankle fracture   Discharge Diagnoses:  Same  History reviewed. No pertinent past medical history.  Surgeries: Procedure(s): OPEN REDUCTION INTERNAL FIXATION talus FRACTURE Incision and drainage reduction subtalor and taor navicularo joint  on 05/31/2013 - 06/01/2013   Consultants:    Discharged Condition: Improved  Hospital Course: Felicia Allison is an 28 y.o. female who was admitted 05/31/2013 for operative treatment of<principal problem not specified>. Patient has severe unremitting pain that affects sleep, daily activities, and work/hobbies. After pre-op clearance the patient was taken to the operating room on 05/31/2013 - 06/01/2013 and underwent  Procedure(s): OPEN REDUCTION INTERNAL FIXATION talus FRACTURE Incision and drainage reduction subtalor and taor navicularo joint .    Patient was given perioperative antibiotics: Anti-infectives   Start     Dose/Rate Route Frequency Ordered Stop   06/01/13 1030  ceFAZolin (ANCEF) IVPB 2 g/50 mL premix     2 g 100 mL/hr over 30 Minutes Intravenous Every 8 hours 06/01/13 0527 06/02/13 1029   06/01/13 0000  cephALEXin (KEFLEX) 500 MG capsule     500 mg Oral 4 times daily 06/01/13 2012     05/31/13 2330  ceFAZolin (ANCEF) IVPB 1 g/50 mL premix     1 g 100 mL/hr over 30 Minutes Intravenous  Once 05/31/13 2329 06/01/13 0055       Patient was given sequential compression devices, early ambulation, and chemoprophylaxis to prevent DVT.  Patient benefited maximally from hospital stay and there were no complications.    Recent vital signs: Patient Vitals for the past 24 hrs:  BP Temp Temp src Pulse Resp SpO2  06/01/13 1938 - 98.8 F (37.1 C) Oral - - -  06/01/13 1800 160/85 mmHg 99.1 F (37.3 C) Oral 78 18 100 %  06/01/13 1343 141/77 mmHg 98.3 F (36.8 C) Oral 82 18 100 %   06/01/13 1000 144/67 mmHg 98.5 F (36.9 C) Oral 80 18 100 %  06/01/13 0850 139/82 mmHg 98.3 F (36.8 C) - 75 18 100 %  06/01/13 0750 135/77 mmHg 98.4 F (36.9 C) - 80 16 100 %  06/01/13 0650 150/82 mmHg 98.4 F (36.9 C) - 83 18 100 %  06/01/13 0555 148/90 mmHg - - 91 18 100 %  06/01/13 0543 145/82 mmHg - - 88 17 100 %  06/01/13 0540 145/82 mmHg 98.3 F (36.8 C) - - - -  06/01/13 0530 141/76 mmHg - - 80 15 100 %  06/01/13 0521 141/76 mmHg 98.2 F (36.8 C) - 77 14 100 %  06/01/13 0515 138/73 mmHg - - 80 15 100 %  06/01/13 0509 - - - 84 13 100 %  06/01/13 0508 145/75 mmHg - - - - -  06/01/13 0500 145/75 mmHg 99.4 F (37.4 C) - 87 16 100 %  05/31/13 2337 - 97.9 F (36.6 C) Oral - - -  05/31/13 2327 134/85 mmHg - - 93 24 99 %     Recent laboratory studies:  Recent Labs  06/01/13 0005  WBC 7.9  HGB 13.5  HCT 39.3  PLT 297  NA 141  K 3.9  CL 103  CO2 24  BUN 11  CREATININE 0.81  GLUCOSE 147*  CALCIUM 9.2     Discharge Medications:     Medication List         aspirin EC 325 MG  tablet  Take 1 tablet (325 mg total) by mouth daily.     aspirin-acetaminophen-caffeine 250-250-65 MG per tablet  Commonly known as:  EXCEDRIN MIGRAINE  Take 2 tablets by mouth every 6 (six) hours as needed for headache.     bisacodyl 5 MG EC tablet  Commonly known as:  DULCOLAX  Take 1 tablet (5 mg total) by mouth daily as needed for moderate constipation.     cephALEXin 500 MG capsule  Commonly known as:  KEFLEX  Take 1 capsule (500 mg total) by mouth 4 (four) times daily.     ondansetron 4 MG tablet  Commonly known as:  ZOFRAN  Take 1 tablet (4 mg total) by mouth every 8 (eight) hours as needed for nausea or vomiting.     oxyCODONE 5 MG immediate release tablet  Commonly known as:  ROXICODONE  Take 1 tablet (5 mg total) by mouth every 4 (four) hours as needed for severe pain.        Diagnostic Studies: Dg Ankle Complete Left  Jun 30, 2013   CLINICAL DATA:  Fall with ankle  pain.  EXAM: LEFT ANKLE COMPLETE - 3+ VIEW  COMPARISON:  None.  FINDINGS: There is a significantly displaced talar neck fracture. Additionally, there is abnormal rotation of the calcaneus relative to the talus. The tibiotalar joint appears located. There is subcutaneous gas consistent with open fracture.  IMPRESSION: Open, displaced talar neck fracture. Suspect subtalar joint injury/malalignment. Suggest CT to more completely evaluate the extent of osseous injury.   Electronically Signed   By: Tiburcio Pea M.D.   On: 2013/06/30 00:06   Ct Ankle Left Wo Contrast  2013/06/30   CLINICAL DATA:  Open fracture.  EXAM: CT OF THE LEFT ANKLE WITHOUT CONTRAST  TECHNIQUE: Multidetector CT imaging was performed according to the standard protocol. Multiplanar CT image reconstructions were also generated.  COMPARISON:  Radiography from yesterday  FINDINGS: There is comminuted fracturing of the talar neck, with is severe displacement causing the proximal fragment to penetrate the lateral anterior ankle skin. There is extensive soft tissue gas, tracking throughout the subcutaneous compartment and into the tibiotalar, talonavicular, and subtalar joints. Gas tracks proximally along the medial and lateral tendon sheaths. There is dislocation of the subtalar joint, with widening especially notable laterally. The distal talus fragment is subluxed at its articulation with the navicular bone, with extensive intra-articular gas. Multiple small osseous fragments within the subtalar joint, measuring up to 5 mm. There is a 3 mm long osseous fragment in the medial gutter of the tibiotubular joint.  Tibialis posterior is presumably torn at its distal attachment. There is no evidence of other tendon tear or entrapment.  IMPRESSION: 1. Comminuted, open talar neck fracture. 2. Hindfoot dislocation that is partially reduced. 3. Fracture fragments within the subtalar joint and in the medial gutter of the tibiotalar joint.   Electronically  Signed   By: Tiburcio Pea M.D.   On: Jun 30, 2013 01:20    Disposition: 01-Home or Self Care      Discharge Orders   Future Orders Complete By Expires   Call MD / Call 911  As directed    Comments:     If you experience chest pain or shortness of breath, CALL 911 and be transported to the hospital emergency room.  If you develope a fever above 101 F, pus (white drainage) or increased drainage or redness at the wound, or calf pain, call your surgeon's office.   Constipation Prevention  As directed  Comments:     Drink plenty of fluids.  Prune juice may be helpful.  You may use a stool softener, such as Colace (over the counter) 100 mg twice a day.  Use MiraLax (over the counter) for constipation as needed.   Diet - low sodium heart healthy  As directed    Discharge instructions  As directed    Comments:     Non-weight bearing at all times!  Do not remove splint.  Take Aspirin 1 tab a day for the next 30 days to prevent blood clots.  Elevate left leg 25 hours per day.  You may shower on Tuesday, but do not let splint get wet.  May apply ice for up to 20 minutes at a time for pain and swelling.  Follow up appointment at our office in one week.  Please call and make appointment.   Increase activity slowly as tolerated  As directed       Follow-up Information   Follow up with [facility], MD. Schedule an appointment as soon as possible for a visit in 1 week.   Specialty:  Orthopedic Surgery   Contact information:   [address] [place] [address] [phone]        Signed: Gearldine Shown 06/01/2013, 8:13 PM

## 2013-06-01 NOTE — H&P (Signed)
  28 yo Female, vertical load inversion injury to left ankle. Open talar neck fracture with extruded distal fragment thru open anterolateral wound. Also subtalar dislocation. Distal NV intact. Grade 3 open but minimal contamination. X-ray/CT scan reviewed. To OR urgently for I&D, reduction, fixation of fracture. SIGNIFICANT RISKS OF INFECTION, DELAYED HEALING, NONUNION AND AVN OF TALUS EXPLAINED. Full H&P done in [facility].

## 2013-06-01 NOTE — H&P (Signed)
HPI: This is a 28 year old female who presents to the [place] ED for left ankle pain and deformity.  She states that a few hours ago she fell off of the top of her car while playing chase with her dog.  She landed on her left ankle which was inverted as she fell.  Immediate pain and deformity.    Past medical, family and social history reviewed in detail.  Review of systems as detailed in HPI, all others reviewed and are negative.    Exam: Well developed, well nourished female in no acute distress.  Alert and oriented x3.   Skin: open fracture of distal talus.  Grade 3 wound with minimal contamination. Musculoskeletal:  open distal talus fracture.    Subtalar joint dislocated.   Neurovascularly intact distally.   EHL and FHL firing.   CT scan reveals open fracture distal talus.  Dislocation of subtalar joint.    Impression: Open fracture distal talus.  Dislocation of subtalar joint.  Plan:  OR tonight for ORIF distal talus fracture Antibiotics given Patient will be admitted until this Sunday    

## 2013-06-01 NOTE — Evaluation (Signed)
Physical Therapy Evaluation Patient Details Name: Felicia Allison MRN: [id number] DOB: Jan 19, 1986 Today's Date: 06/01/2013   History of Present Illness  s/p ORIF left ankle  Clinical Impression  Pt moving well after education regarding NWB, safety; pain controlled currently; she will need a RW fo rhome    Follow Up Recommendations      Equipment Recommendations       Recommendations for Other Services       Precautions / Restrictions Restrictions LLE Weight Bearing: Non weight bearing      Mobility  Bed Mobility Overal bed mobility: Needs Assistance Bed Mobility: Supine to Sit     Supine to sit: Supervision;Min guard     General bed mobility comments: for LLE, cues for self assist  Transfers Overall transfer level: Needs assistance Equipment used: Rolling walker (2 wheeled) Transfers: Sit to/from Stand Sit to Stand: Min guard         General transfer comment: from bed and toilet, min/guard for initial balance and wt shif to stand;   Ambulation/Gait   Ambulation Distance (Feet): 40 Feet (and 15' more) Assistive device: Rolling walker (2 wheeled)       General Gait Details: cues for use of UEs, and RW position  Stairs Stairs: Yes   Stair Management: Two rails;Forwards   General stair comments: cues for sequence and safety  Wheelchair Mobility    Modified Rankin (Stroke Patients Only)       Balance                                     Pertinent Vitals/Pain     Home Living Family/patient expects to be discharged to:: Private residence Living Arrangements: Spouse/significant other   Type of Home: House (town) Home Access: Stairs to enter   Secretary/administratorntrance Stairs-Number of Steps: 0 Home Layout: Two level;1/2 bath on main level Home Equipment: None      Prior Function Level of Independence: Independent               Hand Dominance        Extremity/Trunk Assessment   Upper Extremity Assessment: Overall WFL for tasks  assessed           Lower Extremity Assessment: LLE deficits/detail         Communication   Communication: No difficulties  Cognition Arousal/Alertness: Awake/alert Behavior During Therapy: WFL for tasks assessed/performed Overall Cognitive Status: Within Functional Limits for tasks assessed                      General Comments      Exercises        Assessment/Plan    PT Assessment    PT Diagnosis     PT Problem List    PT Treatment Interventions     PT Goals (Current goals can be found in the Care Plan section) Acute Rehab PT Goals Patient Stated Goal: I, go home today PT Goal Formulation: No goals set, d/c therapy    Frequency     Barriers to discharge        End of Session Equipment Utilized During Treatment: Gait belt Activity Tolerance: Patient tolerated treatment well Patient left: in chair;with call bell/phone within reach         Time: 1418-1450 PT Time Calculation (min): 32 min   Charges:   PT Evaluation $Initial PT Evaluation Tier I: 1 Procedure PT Treatments $  Gait Training: 23-37 mins   PT G Codes:          Kiandria Clum 08-Jun-2013, 3:00 PM

## 2013-06-01 NOTE — Preoperative (Signed)
Beta Blockers   Reason not to administer Beta Blockers:Not Applicable 

## 2013-06-01 NOTE — H&P (View-Only) (Signed)
HPI: This is a 28 year old female who [facility] for left ankle pain and deformity.  She states that a few hours ago she fell off of the top of her car while playing chase with her dog.  She landed on her left ankle which was inverted as she fell.  Immediate pain and deformity.    Past medical, family and social history reviewed in detail.  Review of systems as detailed in HPI, all others reviewed and are negative.    Exam: Well developed, well nourished female in no acute distress.  Alert and oriented x3.   Skin: open fracture of distal talus.  Grade 3 wound with minimal contamination. Musculoskeletal:  open distal talus fracture.    Subtalar joint dislocated.   Neurovascularly intact distally.   EHL and FHL firing.   CT scan reveals open fracture distal talus.  Dislocation of subtalar joint.    Impression: Open fracture distal talus.  Dislocation of subtalar joint.  Plan:  OR tonight for ORIF distal talus fracture Antibiotics given Patient will be admitted until this Sunday

## 2013-06-01 NOTE — Anesthesia Procedure Notes (Signed)
Procedure Name: Intubation Date/Time: 06/01/2013 2:40 AM Performed by: Edison PaceGRAY, Diane Hanel E Pre-anesthesia Checklist: Patient identified, Patient being monitored, Timeout performed, Emergency Drugs available and Suction available Patient Re-evaluated:Patient Re-evaluated prior to inductionOxygen Delivery Method: Circle system utilized Preoxygenation: Pre-oxygenation with 100% oxygen Intubation Type: IV induction, Cricoid Pressure applied and Rapid sequence Laryngoscope Size: Mac and 3 Grade View: Grade II Tube type: Oral Tube size: 7.5 mm Number of attempts: 1 Airway Equipment and Method: Stylet Placement Confirmation: ETT inserted through vocal cords under direct vision,  positive ETCO2 and breath sounds checked- equal and bilateral Secured at: 21 cm Tube secured with: Tape Dental Injury: Teeth and Oropharynx as per pre-operative assessment

## 2013-06-02 LAB — BASIC METABOLIC PANEL
BUN: 8 mg/dL (ref 6–23)
CHLORIDE: 102 meq/L (ref 96–112)
CO2: 24 mEq/L (ref 19–32)
CREATININE: 0.63 mg/dL (ref 0.50–1.10)
Calcium: 9.2 mg/dL (ref 8.4–10.5)
GFR calc Af Amer: >90 mL/min (ref >90)
Glucose, Bld: 109 mg/dL — ABNORMAL HIGH (ref 70–99)
Potassium: 4.2 mEq/L (ref 3.7–5.3)
Sodium: 138 mEq/L (ref 137–147)

## 2013-06-02 NOTE — Progress Notes (Signed)
Physical Therapy Treatment Patient Details Name: Felicia Allison MRN: [id number] DOB: 1985/05/29 Today's Date: 06/02/2013    History of Present Illness s/p ORIF left ankle    PT Comments    Asked to see pt again by RN, pt family reports she  Does have stairs to get into house with only rail and that they need to practice again;   Pt seen again for gait training, stair training; Pt is not safe to amb with crutches, very unsteady; pt and friend now in agreement with RW for home; RN aware  Follow Up Recommendations  No PT follow up     Equipment Recommendations  Rolling walker with 5" wheels    Recommendations for Other Services       Precautions / Restrictions Precautions Precautions: Fall Restrictions LLE Weight Bearing: Non weight bearing    Mobility  Bed Mobility Overal bed mobility: Needs Assistance Bed Mobility: Supine to Sit     Supine to sit: Min assist     General bed mobility comments: for LLE, cues for self assist  Transfers Overall transfer level: Needs assistance Equipment used: Rolling walker (2 wheeled) Transfers: Sit to/from Stand Sit to Stand: Min assist         General transfer comment: pt with near fall standing to crutches from EOB;  pt hand repositioned on crutches and able to stand  with min assist  Ambulation/Gait Ambulation/Gait assistance: Min assist Ambulation Distance (Feet): 50 Feet Assistive device: Rolling walker (2 wheeled);Crutches       General Gait Details: cues for use of UEs, and RW position; pt with LOB x 3 with crutches;    Stairs Stairs: Yes Stairs assistance: +2 safety/equipment Stair Management: One rail Right;With crutches Number of Stairs: 3 General stair comments: cues for sequence and safety; min assist to maintain balance   Wheelchair Mobility    Modified Rankin (Stroke Patients Only)       Balance                                    Cognition Arousal/Alertness: Awake/alert Behavior  During Therapy: WFL for tasks assessed/performed Overall Cognitive Status: Within Functional Limits for tasks assessed                      Exercises      General Comments        Pertinent Vitals/Pain Pain controlled    Home Living                      Prior Function            PT Goals (current goals can now be found in the care plan section) Acute Rehab PT Goals Patient Stated Goal: I, go home today PT Goal Formulation: No goals set, d/c therapy    Frequency       PT Plan Current plan remains appropriate    End of Session Equipment Utilized During Treatment: Gait belt Activity Tolerance: Patient tolerated treatment well Patient left: in bed;with call bell/phone within reach;with family/visitor present     Time: 1351-1415 PT Time Calculation (min): 24 min  Charges:  $Gait Training: 8-22 mins                    G Codes:      Alto Gandolfo 06/02/2013, 2:34 PM

## 2013-06-02 NOTE — Progress Notes (Signed)
[facility] notified for DME for home. Isidoro DonningAlesia Mariam Helbert RN CCM Case Mgmt phone [phone]

## 2013-06-02 NOTE — Op Note (Signed)
NAMKathalene Allison:  Lembo, Shatia                  ACCOUNT NO.:  [id number]  MEDICAL RECORD NO.:  [id number]  LOCATION:  1343                         [facility]  PHYSICIAN:  Loreta Aveaniel F. Aigner Horseman, M.D. DATE OF BIRTH:  October 14, 1985  DATE OF PROCEDURE:  06/01/2013 DATE OF DISCHARGE:  06/02/2013                              OPERATIVE REPORT   PREOPERATIVE DIAGNOSES:  Left ankle foot.  Open fracture dislocation of talus with extrusion of the distal talus out through anterolateral wound.  Dislocation of subtalar joint as well as patella navicular joint.  Minimal gross contamination.  POSTOPERATIVE DIAGNOSIS:  Left ankle foot.  Open fracture dislocation of talus with extrusion of the distal talus out through anterolateral wound.  Dislocation of subtalar joint as well as patella navicular joint.  Minimal gross contamination with also complete disruption of anterior talofibular ligament and lateral capsule ankle.  PROCEDURE:  Left ankle-foot extensive irrigation debridement.  Open reduction of subtalar and talonavicular dislocation.  Open reduction and internal fixation of the talus with a cannulated 4.0 screw placed from medial to lateral across the oblique distal fracture.  Primary repair of lateral ligaments and capsular structures ankle.  SURGEON:  Loreta Aveaniel F. Dyshawn Cangelosi, M.D.  ASSISTANT:  Odelia GageLindsey Anton, PA, present throughout the entire case and necessary for timely completion of procedure.  ANESTHESIA:  General.  BLOOD LOSS:  Minimal.  SPECIMENS:  None.  COUNTS:  None.  COMPLICATIONS:  None.  DRESSINGS:  Soft compressive with a well-padded short-leg splint.  TOURNIQUET:  Not employed.  DESCRIPTION OF PROCEDURE:  The patient was brought to operating room, placed on the operating table in supine position.  After adequate anesthesia had been obtained, prepped and draped in usual sterile fashion.  As this was an open wound, I did not inflate the tourniquet. The distal lateral talus had  buttonholed out through the skin through a 3 cm jagged wound.  The remaining midfoot and forefoot over into an inverted position.  This was thoroughly irrigated with pulse lavage. The open wound was a large proximal and distal.  With this maneuver, I could then gain access.  I could reduce the talus into anatomic position, which also reduced the talonavicular and subtalar joint.  This is very unstable and hard to manage.  Once I can get this reduced and held it in a reduced position, a guidewire x2 was placed from lateral to medial across the TLS from the segment laterally that had extruded through the wound over into the distal medial segment.  Care was taken to align the articular fracture at the talonavicular joint.  After numerous trials, I was finally able to get a good guidewire and then a pre drilled and countersunk 4.0 screw with washer, which locking and the 2 Taylor fragments then maintained alignment of the fracture of the reduction.  The wound was irrigated once again.  The disrupted lateral ligaments and ankle capsule was then approximated with Vicryl.  Wound irrigated and then very loosely closed with nylon.  Sterile compressive dressing applied.  A very well-padded short-leg splint with posterior medial and lateral splints were applied.  Once that was complete, I again used fluoroscopy to confirm maintenance of  reduction of the fracture and dislocation in a very good position.  Anesthesia reversed. Brought to the recovery room.  Tolerated the surgery well.  No complications.     Loreta Ave, M.D.     DFM/MEDQ  D:  06/02/2013  T:  06/02/2013  Job:  [id number]

## 2013-06-02 NOTE — Progress Notes (Signed)
Pt stable, scripts, d/c instructions, and equipment given to pt with no questions/concerns voiced by pt or family.  Pt transported via wheelchair to private vehicle by NT and family.

## 2013-06-03 ENCOUNTER — Encounter (HOSPITAL_COMMUNITY): Payer: Self-pay | Admitting: Orthopedic Surgery

## 2013-06-05 NOTE — Anesthesia Postprocedure Evaluation (Signed)
  Anesthesia Post-op Note  Patient: Felicia Allison  Procedure(s) Performed: Procedure(s) (LRB): OPEN REDUCTION INTERNAL FIXATION talus FRACTURE Incision and drainage reduction subtalor and taor navicularo joint  (Left)  Patient Location: PACU  Anesthesia Type: General  Level of Consciousness: awake and alert   Airway and Oxygen Therapy: Patient Spontanous Breathing  Post-op Pain: mild  Post-op Assessment: Post-op Vital signs reviewed, Patient's Cardiovascular Status Stable, Respiratory Function Stable, Patent Airway and No signs of Nausea or vomiting  Last Vitals:  Filed Vitals:   06/02/13 1400  BP: 162/116  Pulse: 97  Temp: 36.7 C  Resp: 18    Post-op Vital Signs: stable   Complications: No apparent anesthesia complications

## 2014-06-13 ENCOUNTER — Encounter: Payer: Self-pay | Admitting: *Deleted

## 2014-06-13 ENCOUNTER — Ambulatory Visit (INDEPENDENT_AMBULATORY_CARE_PROVIDER_SITE_OTHER): Payer: 59 | Admitting: Family Medicine

## 2014-06-13 VITALS — BP 142/100 | HR 92 | Temp 98.2°F | Resp 20 | Ht 69.5 in | Wt 292.0 lb

## 2014-06-13 DIAGNOSIS — J209 Acute bronchitis, unspecified: Secondary | ICD-10-CM

## 2014-06-13 MED ORDER — HYDROCODONE-HOMATROPINE 5-1.5 MG/5ML PO SYRP
5.0000 mL | ORAL_SOLUTION | Freq: Three times a day (TID) | ORAL | Status: AC | PRN
Start: 2014-06-13 — End: ?

## 2014-06-13 MED ORDER — PREDNISONE 20 MG PO TABS: 40.0000 mg | ORAL_TABLET | Freq: Every day | ORAL | Status: AC

## 2014-06-13 NOTE — Progress Notes (Signed)
Patient ID: Felicia Allison MRN: [id number], DOB: 1985-06-12, 29 y.o. Date of Encounter: 06/13/2014, 10:09 AM  Primary Physician: Default, Provider, MD  Chief Complaint:  Chief Complaint  Patient presents with  . Allergic Rhinitis     allergies are not getting better  . Cough    dry cough.  denies any SOB.  sharp pain in her head with the cough    HPI: 29 y.o. year old female presents with a 14 day history of nasal congestion, post nasal drip, sore throat, and cough. Mild sinus pressure. Afebrile. No chills. Nasal congestion thick and green/yellow. Cough is productive of green/yellow sputum and not associated with time of day. Ears feel full, leading to sensation of muffled hearing. Has tried OTC cold preps without success. No GI complaints.   Patient has also tried [employer] and an inhaler without any improvement. She gets this same cough every year at this time.  No sick contacts, recent antibiotics, or recent travels.   No leg trauma, sedentary periods, h/o cancer, or tobacco use.  Past Medical History  Diagnosis Date  . Allergy      Home Meds: Prior to Admission medications   Medication Sig Start Date End Date Taking? Authorizing Provider  albuterol (PROVENTIL HFA;VENTOLIN HFA) 108 (90 BASE) MCG/ACT inhaler Inhale 2 puffs into the lungs every 6 (six) hours as needed for wheezing or shortness of breath.   Yes Historical Provider, MD  benzonatate (TESSALON) 100 MG capsule Take 100 mg by mouth 3 (three) times daily as needed for cough.   Yes Historical Provider, MD  levocetirizine (XYZAL) 5 MG tablet Take 5 mg by mouth every evening.   Yes Historical Provider, MD  aspirin EC 325 MG tablet Take 1 tablet (325 mg total) by mouth daily. Patient not taking: Reported on 06/13/2014 06/01/13   Cristie Hem, PA-C  aspirin-acetaminophen-caffeine (EXCEDRIN MIGRAINE) [phone] MG per tablet Take 2 tablets by mouth every 6 (six) hours as needed for headache.    Historical Provider, MD    bisacodyl (DULCOLAX) 5 MG EC tablet Take 1 tablet (5 mg total) by mouth daily as needed for moderate constipation. Patient not taking: Reported on 06/13/2014 06/01/13   Cristie Hem, PA-C  cephALEXin (KEFLEX) 500 MG capsule Take 1 capsule (500 mg total) by mouth 4 (four) times daily. Patient not taking: Reported on 06/13/2014 06/01/13   Cristie Hem, PA-C  ondansetron (ZOFRAN) 4 MG tablet Take 1 tablet (4 mg total) by mouth every 8 (eight) hours as needed for nausea or vomiting. Patient not taking: Reported on 06/13/2014 06/01/13   Cristie Hem, PA-C  oxyCODONE (ROXICODONE) 5 MG immediate release tablet Take 1 tablet (5 mg total) by mouth every 4 (four) hours as needed for severe pain. Patient not taking: Reported on 06/13/2014 06/01/13   Cristie Hem, PA-C    Allergies: No Known Allergies  History   Social History  . Marital Status: Single    Spouse Name: N/A  . Number of Children: N/A  . Years of Education: N/A   Occupational History  . Not on file.   Social History Main Topics  . Smoking status: Former Smoker    Types: Cigarettes  . Smokeless tobacco: Not on file  . Alcohol Use: 0.0 oz/week    0 Standard drinks or equivalent per week  . Drug Use: No  . Sexual Activity: Not on file   Other Topics Concern  . Not on file   Social History  Narrative     Review of Systems: Constitutional: negative for chills, fever, night sweats or weight changes Cardiovascular: negative for chest pain or palpitations Respiratory: negative for hemoptysis, wheezing, or shortness of breath Abdominal: negative for abdominal pain, nausea, vomiting or diarrhea Dermatological: negative for rash Neurologic: negative for headache   Physical Exam: Blood pressure 142/100, pulse 92, temperature 98.2 F (36.8 C), temperature source Oral, resp. rate 20, height 5' 9.5" (1.765 m), weight 292 lb (132.45 kg), last menstrual period 06/07/2014, SpO2 98 %., Body mass index is 42.52 kg/(m^2). General:  Well developed, well nourished, in no acute distress. Head: Normocephalic, atraumatic, eyes without discharge, sclera non-icteric, nares are congested. Bilateral auditory canals clear, TM's are without perforation, pearly grey with reflective cone of light bilaterally. No sinus TTP. Oral cavity moist, dentition normal. Posterior pharynx with post nasal drip and mild erythema. No peritonsillar abscess or tonsillar exudate. Neck: Supple. No thyromegaly. Full ROM. No lymphadenopathy. Lungs: Coarse breath sounds bilaterally without wheezes, rales, or rhonchi. Breathing is unlabored.  Heart: RRR with S1 S2. No murmurs, rubs, or gallops appreciated. Msk:  Strength and tone normal for age. Extremities: No clubbing or cyanosis. No edema. Neuro: Alert and oriented X 3. Moves all extremities spontaneously. CNII-XII grossly in tact. Psych:  Responds to questions appropriately with a normal affect.    ASSESSMENT AND PLAN:  29 y.o. year old female with bronchitis. - This chart was scribed in my presence and reviewed by me personally.    ICD-9-CM ICD-10-CM   1. Acute bronchitis, unspecified organism 466.0 J20.9 predniSONE (DELTASONE) 20 MG tablet     HYDROcodone-homatropine (HYCODAN) 5-1.5 MG/5ML syrup     Signed, Elvina SidleKurt Daliana Leverett, MD   -Tylenol/Motrin prn -Rest/fluids -RTC precautions -RTC 3-5 days if no improvement  Signed, Elvina SidleKurt Fronnie Urton, MD 06/13/2014 10:09 AM

## 2014-06-13 NOTE — Patient Instructions (Signed)
In mind when you're trying to lose weight: 1 avoid foods with preservatives To avoid foods with high fructose or cane sugar. 3 avoid artificial sweeteners 4 avoid soft drinks and sweet tea 5 limited carbohydrates to one portion per meal (bread, rice, potatoes, pasta) 6     Acute Bronchitis Bronchitis is inflammation of the airways that extend from the windpipe into the lungs (bronchi). The inflammation often causes mucus to develop. This leads to a cough, which is the most common symptom of bronchitis.  In acute bronchitis, the condition usually develops suddenly and goes away over time, usually in a couple weeks. Smoking, allergies, and asthma can make bronchitis worse. Repeated episodes of bronchitis may cause further lung problems.  CAUSES Acute bronchitis is most often caused by the same virus that causes a cold. The virus can spread from person to person (contagious) through coughing, sneezing, and touching contaminated objects. SIGNS AND SYMPTOMS   Cough.   Fever.   Coughing up mucus.   Body aches.   Chest congestion.   Chills.   Shortness of breath.   Sore throat.  DIAGNOSIS  Acute bronchitis is usually diagnosed through a physical exam. Your health care provider will also ask you questions about your medical history. Tests, such as chest X-rays, are sometimes done to rule out other conditions.  TREATMENT  Acute bronchitis usually goes away in a couple weeks. Oftentimes, no medical treatment is necessary. Medicines are sometimes given for relief of fever or cough. Antibiotic medicines are usually not needed but may be prescribed in certain situations. In some cases, an inhaler may be recommended to help reduce shortness of breath and control the cough. A cool mist vaporizer may also be used to help thin bronchial secretions and make it easier to clear the chest.  HOME CARE INSTRUCTIONS  Get plenty of rest.   Drink enough fluids to keep your urine clear or pale  yellow (unless you have a medical condition that requires fluid restriction). Increasing fluids may help thin your respiratory secretions (sputum) and reduce chest congestion, and it will prevent dehydration.   Take medicines only as directed by your health care provider.  If you were prescribed an antibiotic medicine, finish it all even if you start to feel better.  Avoid smoking and secondhand smoke. Exposure to cigarette smoke or irritating chemicals will make bronchitis worse. If you are a smoker, consider using nicotine gum or skin patches to help control withdrawal symptoms. Quitting smoking will help your lungs heal faster.   Reduce the chances of another bout of acute bronchitis by washing your hands frequently, avoiding people with cold symptoms, and trying not to touch your hands to your mouth, nose, or eyes.   Keep all follow-up visits as directed by your health care provider.  SEEK MEDICAL CARE IF: Your symptoms do not improve after 1 week of treatment.  SEEK IMMEDIATE MEDICAL CARE IF:  You develop an increased fever or chills.   You have chest pain.   You have severe shortness of breath.  You have bloody sputum.   You develop dehydration.  You faint or repeatedly feel like you are going to pass out.  You develop repeated vomiting.  You develop a severe headache. MAKE SURE YOU:   Understand these instructions.  Will watch your condition.  Will get help right away if you are not doing well or get worse. Document Released: 03/31/2004 Document Revised: 07/08/2013 Document Reviewed: 08/14/2012 [facility] Patient Information 2015 [employer], [place]. This information is  not intended to replace advice given to you by your health care provider. Make sure you discuss any questions you have with your health care provider.

## 2015-02-10 IMAGING — CT CT ANKLE*L* W/O CM
2 of 4 series · 4 of 14 positions shown, 5 images · non-contrast
Comparison: Radiography from yesterday

CLINICAL DATA: Open fracture.

EXAM:
CT OF THE LEFT ANKLE WITHOUT CONTRAST
TECHNIQUE: Multidetector CT imaging was performed according to the standard
protocol. Multiplanar CT image reconstructions were also generated.

[Series 6: foot/ankle bone windows · axial · 0.29mm/px · z∈[+99,+155]mm · 2 of 86 slices shown, 3 images]
[im 29/86  soft-tissue]
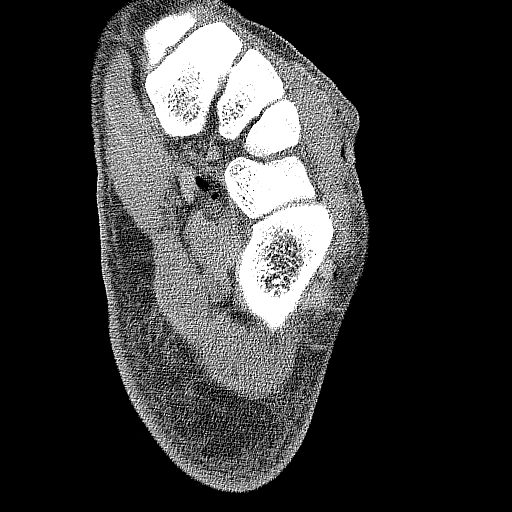
[im 29/86  bone]
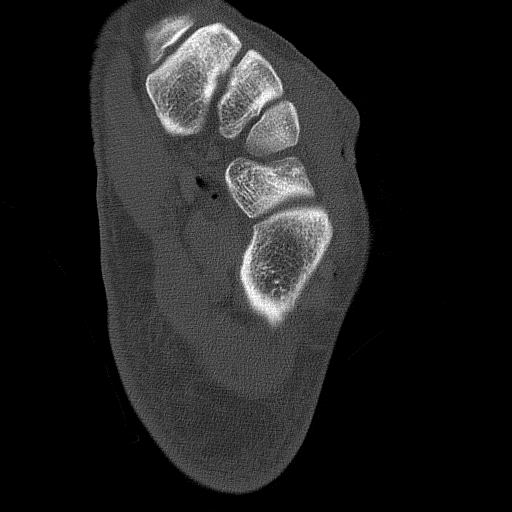
[im 57/86  bone]
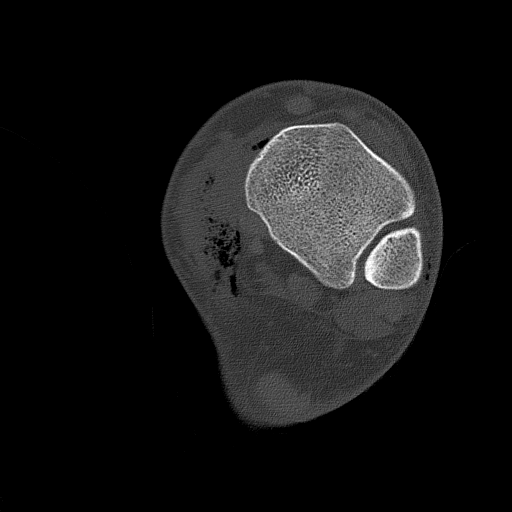

[Series 7: foot/ankle st · axial · 0.29mm/px · z∈[+99,+155]mm · 2 of 86 slices shown]
[im 29/86  bone]
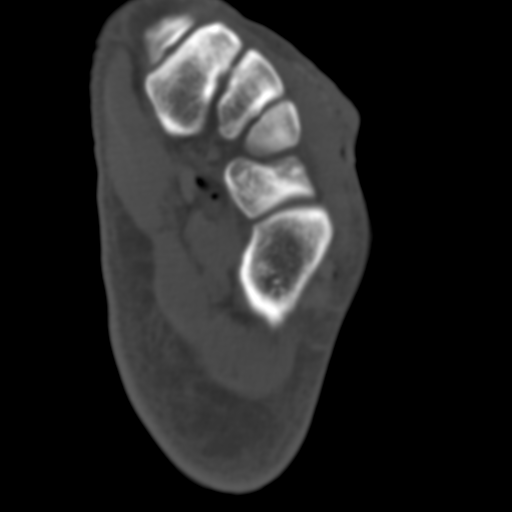
[im 57/86  bone]
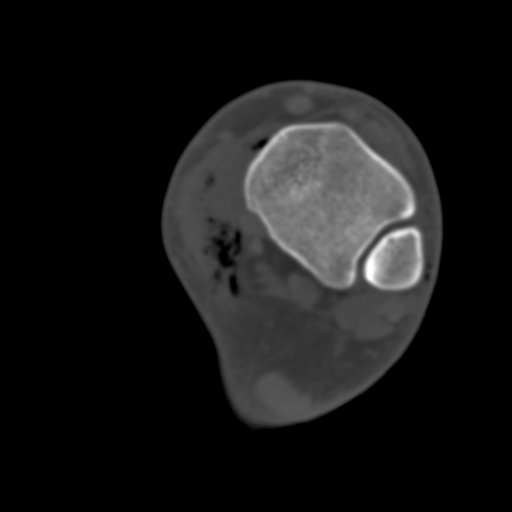

[4 of 14 positions shown; findings below may reference images not displayed]

FINDINGS: There is comminuted fracturing of the talar neck, with is severe
displacement causing the proximal fragment to penetrate the lateral
anterior ankle skin. There is extensive soft tissue gas, tracking
throughout the subcutaneous compartment and into the tibiotalar,
talonavicular, and subtalar joints. Gas tracks proximally along the
medial and lateral tendon sheaths. There is dislocation of the
subtalar joint, with widening especially notable laterally. The
distal talus fragment is subluxed at its articulation with the
navicular bone, with extensive intra-articular gas. Multiple small
osseous fragments within the subtalar joint, measuring up to 5 mm.
There is a 3 mm long osseous fragment in the medial gutter of the
tibiotubular joint.

Tibialis posterior is presumably torn at its distal attachment.
There is no evidence of other tendon tear or entrapment.
IMPRESSION: 1. Comminuted, open talar neck fracture.
2. Hindfoot dislocation that is partially reduced.
3. Fracture fragments within the subtalar joint and in the medial
gutter of the tibiotalar joint.

## 2019-06-06 ENCOUNTER — Ambulatory Visit: Payer: Self-pay | Attending: Family

## 2019-06-06 DIAGNOSIS — Z23 Encounter for immunization: Secondary | ICD-10-CM

## 2019-06-06 NOTE — Progress Notes (Signed)
   Covid-19 Vaccination Clinic  Name:  Felicia Allison    MRN: [id number] DOB: 10/16/1985  06/06/2019  Felicia Allison was observed post Covid-19 immunization for 15 minutes without incident. She was provided with Vaccine Information Sheet and instruction to access the V-Safe system.   Felicia Allison was instructed to call 911 with any severe reactions post vaccine: Marland Kitchen Difficulty breathing  . Swelling of face and throat  . A fast heartbeat  . A bad rash all over body  . Dizziness and weakness   Immunizations Administered    Name Date Dose VIS Date Route   Moderna COVID-19 Vaccine 06/06/2019 12:23 PM 0.5 mL 02/05/2019 Intramuscular   Manufacturer: Moderna   Lot: [id number]   NDC: 53748-270-78

## 2019-07-09 ENCOUNTER — Ambulatory Visit: Payer: Self-pay | Attending: Family

## 2019-07-09 DIAGNOSIS — Z23 Encounter for immunization: Secondary | ICD-10-CM

## 2019-07-09 NOTE — Progress Notes (Signed)
   Covid-19 Vaccination Clinic  Name:  Felicia Allison    MRN: [id number] DOB: [birth year]  07/09/2019  [name] was observed post Covid-19 immunization for 15 minutes without incident. She was provided with Vaccine Information Sheet and instruction to access the V-Safe system.   [name] was instructed to call 911 with any severe reactions post vaccine: Marland Kitchen Difficulty breathing  . Swelling of face and throat  . A fast heartbeat  . A bad rash all over body  . Dizziness and weakness   Immunizations Administered    Name Date Dose VIS Date Route   Moderna COVID-19 Vaccine 07/09/2019 12:07 PM 0.5 mL 02/2019 Intramuscular   Manufacturer: Moderna   Lot: [id number]   NDC: 88416-606-30

## 2024-01-03 ENCOUNTER — Encounter: Payer: Self-pay | Admitting: Obstetrics and Gynecology

## 2024-01-03 ENCOUNTER — Ambulatory Visit (INDEPENDENT_AMBULATORY_CARE_PROVIDER_SITE_OTHER): Admitting: Obstetrics and Gynecology

## 2024-01-03 VITALS — BP 139/102 | HR 74 | Ht 70.0 in | Wt 244.0 lb

## 2024-01-03 DIAGNOSIS — N939 Abnormal uterine and vaginal bleeding, unspecified: Secondary | ICD-10-CM

## 2024-01-03 DIAGNOSIS — D509 Iron deficiency anemia, unspecified: Secondary | ICD-10-CM | POA: Diagnosis not present

## 2024-01-03 NOTE — Progress Notes (Signed)
 NEW GYNECOLOGY VISIT Chief Complaint  Patient presents with   Vaginal Bleeding    DUB      Subjective:  Discussed the use of AI scribe software for clinical note transcription with the patient, who gave verbal consent to proceed.  History of Present Illness   Felicia Allison is a 38 year old female with heavy menstrual bleeding and anemia who presents for evaluation of her menstrual issues and potential fertility concerns.  She experiences heavy menstrual bleeding lasting four to five days, with two days of very heavy flow requiring changing super pads every hour to hour and a half. This has persisted for the past year, leading to anemia and necessitating iron infusions. Despite treatment, her iron levels have dropped again. She has not used medications like birth control pills or hormone therapy to regulate her periods and has never tried an IUD.  She takes lisinopril for high blood pressure. She is on weight loss medication, currently Zepbound, and receives weekly allergy shots. She is single, and exclusively in same-sex relationships. She works two jobs and is in school, making regular infusions challenging. She has no history of pregnancy or contraceptive use for menstrual regulation. Recent Pap smear and STD testing were normal.       Gyn History: Contraception: no method, same sex relationships Last pap: reported as 02/2023 in [facility] medical records, no result listed but next due 2027 History of abnormal pap: No Periods: see above  OB History     Gravida  0   Para  0   Term  0   Preterm  0   AB  0   Living  0      SAB  0   IAB  0   Ectopic  0   Multiple  0   Live Births  0           Past Medical History:  Diagnosis Date   Allergy     Past Surgical History:  Procedure Laterality Date   ORIF ANKLE FRACTURE Left 06/01/2013   Procedure: OPEN REDUCTION INTERNAL FIXATION talus FRACTURE Incision and drainage reduction subtalor and taor navicularo joint  ;  Surgeon: Toribio JULIANNA Chancy, MD;  Location: [facility];  Service: Orthopedics;  Laterality: Left;    Social History   Socioeconomic History   Marital status: Single    Spouse name: Not on file   Number of children: Not on file   Years of education: Not on file   Highest education level: Not on file  Occupational History   Not on file  Tobacco Use   Smoking status: Former    Types: Cigarettes   Smokeless tobacco: Not on file  Substance and Sexual Activity   Alcohol use: Yes    Alcohol/week: 0.0 standard drinks of alcohol   Drug use: No   Sexual activity: Not Currently  Other Topics Concern   Not on file  Social History Narrative   Not on file   Social Drivers of Health   Financial Resource Strain: Not on file  Food Insecurity: Not on file  Transportation Needs: Not on file  Physical Activity: Not on file  Stress: Not on file  Social Connections: Not on file    Family History  Problem Relation Age of Onset   Cancer Maternal Grandmother    Diabetes Maternal Grandmother    Diabetes Maternal Grandfather    Hypertension Maternal Grandfather    Diabetes Mother    Stroke Mother  Hyperlipidemia Mother     Current Outpatient Medications on File Prior to Visit  Medication Sig Dispense Refill   albuterol (VENTOLIN HFA) 108 (90 Base) MCG/ACT inhaler Inhale 2 puffs into the lungs.     desloratadine (CLARINEX) 5 MG tablet Take 5 mg by mouth daily.     EPINEPHrine 0.3 mg/0.3 mL IJ SOAJ injection Inject into the muscle.     fluticasone (FLONASE) 50 MCG/ACT nasal spray Place 1 spray into the nose.     HYDROcodone -homatropine (HYCODAN) 5-1.5 MG/5ML syrup Take 5 mLs by mouth every 8 (eight) hours as needed for cough. 120 mL 0   lisinopril-hydrochlorothiazide (ZESTORETIC) 10-12.5 MG tablet Take 1 tablet by mouth daily.     predniSONE  (DELTASONE ) 20 MG tablet Take 2 tablets (40 mg total) by mouth daily. 10 tablet 0   Vitamin D, Ergocalciferol, (DRISDOL) 1.25 MG (50000 UNIT) CAPS  capsule Take 50,000 Units by mouth.     ZEPBOUND 15 MG/0.5ML Pen Inject 15 mg into the skin.     No current facility-administered medications on file prior to visit.    No Known Allergies   Objective:   Vitals:   01/03/24 1347 01/03/24 1358  BP: (!) 135/98 (!) 139/102  Pulse: 67 74  Weight: 244 lb (110.7 kg)   Height: 5' 10 (1.778 m)    Physical Examination:   General appearance - well appearing, and in no distress  Mental status - alert, oriented to person, place, and time  Psych:  normal mood and affect  Skin - warm and dry, normal color, no suspicious lesions noted  Abdomen - soft, nontender, nondistended, no masses or organomegaly  Pelvic -  VULVA: normal appearing vulva with no masses, tenderness or lesions   VAGINA: normal appearing vagina with normal color and discharge, no lesions   CERVIX: normal appearing cervix without discharge or lesions, no CMT  UTERUS: uterus feels bulky and slightly enlarged, nontender   ADNEXA: No adnexal masses or tenderness noted.  Extremities:  No swelling or varicosities noted  Chaperone present for exam  Assessment and Plan:  Assessment and Plan  1. Abnormal uterine bleeding (Primary) This is a chronic issue over the last 2 years. Counseled on options for management of menorrhagia including medical options such as oral progesterone, tranexamic acid or IUD as well as surgical options. She declines proceeding with anything today, needs time to consider. Will check labs and pelvic ultrasound. Info given in AVS regarding her options. F/u in a few weeks to discuss results and management options. - US  PELVIC COMPLETE WITH TRANSVAGINAL; Future - CBC - Iron and TIBC - Ferritin  2. Iron deficiency anemia, unspecified iron deficiency anemia type This is a chronic issue, has required iron infusions over the last two years - CBC - Iron and TIBC - Ferritin      Return in about 2-4 weeks (around 01/17/2024) for Review of results and  management.  Future Appointments  Date Time Provider Department Center  01/07/2024 12:00 PM [facility]  01/31/2024  1:30 PM Gordie Crumby, Rollo DASEN, MD [facility] None    Rollo DASEN Bring, MD, FACOG Obstetrician & Gynecologist, [facility] for [facility], [facility]

## 2024-01-03 NOTE — Patient Instructions (Addendum)
  VISIT SUMMARY: You came in today to discuss your heavy menstrual bleeding and anemia. We talked about your symptoms, potential causes, and possible treatments.  YOUR PLAN: HEAVY MENSTRUAL BLEEDING WITH IRON DEFICIENCY ANEMIA: You have heavy menstrual bleeding that has led to anemia, and you prefer medical management. -We will do a pelvic ultrasound to check for fibroids or other issues. -We will do blood work to recheck your blood counts. -We discussed oral progesterone therapy, tranexamic acid (Lysteda), and IUD as treatment options. -Please schedule a follow-up visit in 2-4 weeks to discuss the results and management options.  UTERINE ENLARGEMENT, POSSIBLE FIBROIDS: Your uterus is enlarged, and we suspect fibroids. -We will do a pelvic ultrasound to evaluate for fibroids.                      Contains text generated by [employer].                                 Contains text generated by [employer].

## 2024-01-04 ENCOUNTER — Ambulatory Visit: Payer: Self-pay | Admitting: Obstetrics and Gynecology

## 2024-01-04 DIAGNOSIS — D509 Iron deficiency anemia, unspecified: Secondary | ICD-10-CM

## 2024-01-04 LAB — CBC
Hematocrit: 39.1 % (ref 34.0–46.6)
Hemoglobin: 11.5 g/dL (ref 11.1–15.9)
MCH: 23.3 pg — ABNORMAL LOW (ref 26.6–33.0)
MCHC: 29.4 g/dL — ABNORMAL LOW (ref 31.5–35.7)
MCV: 79 fL (ref 79–97)
Platelets: 377 x10E3/uL (ref 150–450)
RBC: 4.93 x10E6/uL (ref 3.77–5.28)
RDW: 22 % — ABNORMAL HIGH (ref 11.7–15.4)
WBC: 4.7 x10E3/uL (ref 3.4–10.8)

## 2024-01-04 LAB — IRON AND TIBC
Iron Saturation: 8 % — CL (ref 15–55)
Iron: 36 ug/dL (ref 27–159)
Total Iron Binding Capacity: 455 ug/dL — ABNORMAL HIGH (ref 250–450)
UIBC: 419 ug/dL (ref 131–425)

## 2024-01-04 LAB — FERRITIN: Ferritin: 26 ng/mL (ref 15–150)

## 2024-01-04 MED ORDER — FERROUS SULFATE 325 (65 FE) MG PO TBEC: 325.0000 mg | DELAYED_RELEASE_TABLET | ORAL | 3 refills | Status: AC

## 2024-01-07 ENCOUNTER — Ambulatory Visit (HOSPITAL_BASED_OUTPATIENT_CLINIC_OR_DEPARTMENT_OTHER): Attending: Obstetrics and Gynecology

## 2024-01-14 ENCOUNTER — Other Ambulatory Visit: Payer: Self-pay | Admitting: Obstetrics and Gynecology

## 2024-01-14 ENCOUNTER — Ambulatory Visit (HOSPITAL_BASED_OUTPATIENT_CLINIC_OR_DEPARTMENT_OTHER)
Admission: RE | Admit: 2024-01-14 | Discharge: 2024-01-14 | Disposition: A | Discharge status: Home / Self Care | Source: Ambulatory Visit | Attending: Obstetrics and Gynecology | Admitting: Obstetrics and Gynecology

## 2024-01-14 DIAGNOSIS — N939 Abnormal uterine and vaginal bleeding, unspecified: Secondary | ICD-10-CM

## 2024-01-14 DIAGNOSIS — D509 Iron deficiency anemia, unspecified: Secondary | ICD-10-CM

## 2024-01-17 ENCOUNTER — Ambulatory Visit: Payer: Self-pay | Admitting: Obstetrics and Gynecology

## 2024-01-31 ENCOUNTER — Ambulatory Visit: Admitting: Obstetrics and Gynecology

## 2024-01-31 VITALS — BP 110/79 | HR 67 | Wt 252.0 lb

## 2024-01-31 DIAGNOSIS — Z3189 Encounter for other procreative management: Secondary | ICD-10-CM | POA: Diagnosis not present

## 2024-01-31 DIAGNOSIS — D259 Leiomyoma of uterus, unspecified: Secondary | ICD-10-CM | POA: Diagnosis not present

## 2024-01-31 DIAGNOSIS — N939 Abnormal uterine and vaginal bleeding, unspecified: Secondary | ICD-10-CM

## 2024-01-31 MED ORDER — NORETHINDRONE ACETATE 5 MG PO TABS: 5.0000 mg | ORAL_TABLET | Freq: Every day | ORAL | 2 refills | Status: AC

## 2024-01-31 NOTE — Progress Notes (Signed)
   ESTABLISHED GYNECOLOGY VISIT Chief Complaint  Patient presents with   Follow-up    Subjective:  Felicia Allison is a 38 y.o. G0P0000 presenting for follow up  Pelvic ultrasound with suboptimal imaging due to transabdominal approach ULTRASOUND FINDINGS: UTERUS: Uterus measures 11.9 x 6.4 x 7.5 cm, volume 299 ml. Multiple heterogeneous masses throughout the uterus compatible with fibroids. The largest is located centrally, measuring up to 3.8 cm.   ENDOMETRIAL STRIPE: Endometrial stripe not visualized due to central fibroid.   RIGHT OVARY: Right ovary measures 3.8 x 2.0 x 2.0 cm, volume 8 ml. Right ovary is within normal limits. There is normal arterial and venous Doppler flow.   LEFT OVARY: Left ovary measures 3.0 x 1.4 x 2.2 cm, volume 5 ml. Left ovary is within normal limits. There is normal color flow.   FREE FLUID: No free fluid.   IMPRESSION: 1. Multiple uterine fibroids, largest measuring up to 3.8 cm centrally. 2. Endometrium not visualized, likely obscured by central fibroid.  Lab Results  Component Value Date   WBC 4.7 01/03/2024   HGB 11.5 01/03/2024   HCT 39.1 01/03/2024   MCV 79 01/03/2024   PLT 377 01/03/2024    Review of Systems:   Pertinent items are noted in HPI  Pertinent History Reviewed:  Reviewed past medical,surgical, social and family history.  Reviewed problem list, medications and allergies.  Objective:   Vitals:   01/31/24 1456  BP: 110/79  Pulse: 67  Weight: 252 lb (114.3 kg)   Physical Examination:   General appearance - well appearing, and in no distress  Mental status - alert, oriented to person, place, and time  Psych:  normal mood and affect    Assessment and Plan:  1. Abnormal uterine bleeding (Primary) Reviewed options for treatment including medical with TXA, progesterone, IUD or surgical including hysteroscopy, myomectomy if cavitary fibroid (unclear from transabdominal imaging) and/or hysterectomy. She chooses  progesterone. Rx given for norethindrone . F/u in 3 months. In the meantime, will repeat pelvic ultrasound with transvaginal to determine location of fibroid and consider hysteroscopic removal if intracavity - US  PELVIC COMPLETE WITH TRANSVAGINAL; Future - norethindrone  (AYGESTIN ) 5 MG tablet; Take 1 tablet (5 mg total) by mouth daily.  Dispense: 30 tablet; Refill: 2  2. Uterine leiomyoma, unspecified location See above - US  PELVIC COMPLETE WITH TRANSVAGINAL; Future  3. Encounter for fertility planning Requests referral for egg retrieval - Ambulatory referral to Infertility   - Ambulatory referral to Infertility  Rollo ONEIDA Bring, MD, FACOG Obstetrician & Gynecologist, [facility] for [facility], [facility]

## 2024-03-13 ENCOUNTER — Ambulatory Visit (HOSPITAL_BASED_OUTPATIENT_CLINIC_OR_DEPARTMENT_OTHER)

## 2024-04-05 ENCOUNTER — Ambulatory Visit

## 2024-04-05 DIAGNOSIS — N939 Abnormal uterine and vaginal bleeding, unspecified: Secondary | ICD-10-CM

## 2024-04-05 DIAGNOSIS — D259 Leiomyoma of uterus, unspecified: Secondary | ICD-10-CM | POA: Diagnosis not present

## 2024-04-11 ENCOUNTER — Ambulatory Visit: Payer: Self-pay | Admitting: Obstetrics and Gynecology

## 2024-04-12 NOTE — Telephone Encounter (Signed)
 Spoke with patient.  She is aware of her US  results and need for follow up appt.  Patient will schedule.  Erminio DELENA Rumps, RN

## 2024-04-12 NOTE — Telephone Encounter (Signed)
 Spoke with patient and scheduled her for a virtual visit on 2/23.

## 2024-04-12 NOTE — Telephone Encounter (Signed)
-----   Message from Rollo Bring, MD sent at 04/11/2024 12:58 PM EST ----- Please schedule patient f/u appt to discuss u/s results. This can be in-person or virtual depending on her preference. Thank you.

## 2024-04-29 ENCOUNTER — Telehealth: Admitting: Obstetrics and Gynecology
